# Patient Record
Sex: Male | Born: 1980 | Race: Black or African American | Hispanic: No | Marital: Single | Smoking: Current every day smoker
Health system: Southern US, Community
[De-identification: ages and names within clinical notes are randomized; demographics above are authoritative.]

## PROBLEM LIST (undated history)

## (undated) DIAGNOSIS — Z8619 Personal history of other infectious and parasitic diseases: Secondary | ICD-10-CM

## (undated) HISTORY — DX: Personal history of other infectious and parasitic diseases: Z86.19

## (undated) HISTORY — PX: WISDOM TOOTH EXTRACTION: SHX21

---

## 2018-04-12 ENCOUNTER — Encounter (HOSPITAL_COMMUNITY): Payer: Self-pay | Admitting: Emergency Medicine

## 2018-04-12 ENCOUNTER — Other Ambulatory Visit: Payer: Self-pay

## 2018-04-12 ENCOUNTER — Emergency Department (HOSPITAL_COMMUNITY)
Admission: EM | Admit: 2018-04-12 | Discharge: 2018-04-12 | Disposition: A | Discharge status: Home / Self Care | Payer: Self-pay | Attending: Emergency Medicine | Admitting: Emergency Medicine

## 2018-04-12 DIAGNOSIS — H00014 Hordeolum externum left upper eyelid: Secondary | ICD-10-CM | POA: Insufficient documentation

## 2018-04-12 DIAGNOSIS — H01004 Unspecified blepharitis left upper eyelid: Secondary | ICD-10-CM | POA: Insufficient documentation

## 2018-04-12 DIAGNOSIS — H01001 Unspecified blepharitis right upper eyelid: Secondary | ICD-10-CM | POA: Insufficient documentation

## 2018-04-12 MED ORDER — BACITRACIN-POLYMYXIN B 500-10000 UNIT/GM OP OINT: 1.0000 "application " | TOPICAL_OINTMENT | Freq: Every day | OPHTHALMIC | 0 refills | Status: AC

## 2018-04-12 MED ORDER — CLINDAMYCIN HCL 150 MG PO CAPS: 450.0000 mg | ORAL_CAPSULE | Freq: Three times a day (TID) | ORAL | 0 refills | Status: DC

## 2018-04-12 NOTE — ED Notes (Addendum)
Pt reports that for the last 2 weeks he has had swollen eyes when he wakes up in the morning. Pt reports itching and scratching with same. Pt denies any vision problems or drainage.

## 2018-04-12 NOTE — ED Provider Notes (Signed)
[facility] EMERGENCY DEPARTMENT Provider Note   CSN: [id number] Arrival date & time: 04/12/18  2956     History   Chief Complaint Chief Complaint  Patient presents with  . Facial Swelling    HPI Terry Oneill is a 37 y.o. male is here for evaluation of eye swelling.  Patient reports left upper eyelid puffiness since Wednesday, gradually worsening.  Associated with mild tenderness and redness, itching, eyelash crusting and clear watery discharge.  States he feels a firm nodule to the inner upper eyelid and thinks he has a stye.  Notices that the swelling to the eyelid is worse in the mornings, he attributes this to sleeping on his left side.  He has tried warm compresses and stye relief eyedrops.  He wears glasses and contact lenses for up to a month at a time without removing them.  States he has had crusting and dry skin peeling around the base of his lashes for a long time. He denies any vision changes, eye redness, headache.  States he has had and improving, nontender stye to the right upper eyelid for over 1 week.    HPI  History reviewed. No pertinent past medical history.  There are no active problems to display for this patient.   History reviewed. No pertinent surgical history.      Home Medications    Prior to Admission medications   Medication Sig Start Date End Date Taking? Authorizing Provider  bacitracin-polymyxin b (POLYSPORIN) ophthalmic ointment Place 1 application into the left eye at bedtime for 8 days. Nightly, before bed time 04/12/18 04/20/18  Liberty Handy, PA-C  clindamycin (CLEOCIN) 150 MG capsule Take 3 capsules (450 mg total) by mouth 3 (three) times daily for 7 days. 04/12/18 04/19/18  Liberty Handy, PA-C    Family History No family history on file.  Social History Social History   Tobacco Use  . Smoking status: Not on file  Substance Use Topics  . Alcohol use: Not on file  . Drug use: Not on file      Allergies   Patient has no known allergies.   Review of Systems Review of Systems  Eyes: Positive for discharge and itching.       Eyelid swelling   All other systems reviewed and are negative.    Physical Exam Updated Vital Signs BP (!) 168/118 (BP Location: Right Arm)   Pulse 72   Temp 98 F (36.7 C) (Oral)   Resp 16   Ht 6' (1.829 m)   Wt 127 kg   SpO2 97%   BMI 37.97 kg/m   Physical Exam  Constitutional: He is oriented to person, place, and time. He appears well-developed and well-nourished.  Non-toxic appearance.  HENT:  Head: Normocephalic.  Right Ear: External ear normal.  Left Ear: External ear normal.  Nose: Nose normal.  Left upper eye lid puffiness. No significant surrounding facial edema, erythema, tenderness. No sinus tenderness.   Eyes: Conjunctivae and EOM are normal.  RIGHT EYE: PERRL.  EOMs intact, painless. Small, firm, non tender, not inflamed nodule to upper eyelid consistent with stye. Crust and scales around base of upper eyelashes.  Conjunctiva and sclera white without injection, chemosis, prominent vessels.    LEFT EYE: PERRL.  EOMs intact, painless. Small, firm, mildly tender nodule to medial upper eyelid.  Upper eye lid is puffy mildly erythematous and tender diffusely, upper eye lid droops over eye. Crust and scales around base of upper eyelashes.  Conjunctiva  and sclera white without injection, chemosis, prominent vessels.    Neck: Full passive range of motion without pain.  Cardiovascular: Normal rate.  Pulmonary/Chest: Effort normal. No tachypnea. No respiratory distress.  Musculoskeletal: Normal range of motion.  Neurological: He is alert and oriented to person, place, and time.  Skin: Skin is warm and dry. Capillary refill takes less than 2 seconds.  Psychiatric: His behavior is normal. Thought content normal.     ED Treatments / Results  Labs (all labs ordered are listed, but only abnormal results are displayed) Labs Reviewed  - No data to display  EKG None  Radiology No results found.  Procedures Procedures (including critical care time)  Medications Ordered in ED Medications - No data to display   Initial Impression / Assessment and Plan / ED Course  I have reviewed the triage vital signs and the nursing notes.  Pertinent labs & imaging results that were available during my care of the patient were reviewed by me and considered in my medical decision making (see chart for details).    History is most consistent with chronic blepharitis.  This, along with an appropriate use of contact lenses for several weeks at a time, during sleep, probably predisposes patient to stye and/or infection.  Exam as above consistent with an inflamed right, improving stye and left upper eyelid puffiness with a firm, mildly tender nodule to the upper eyelid most likely a stye as well.  He has crusting and scales around the base of his lashes.  He is only minimal focal tenderness along the upper left eyelid but no expanding signs of cellulitis to the orbits, face.  I think his eyelid swelling is most likely inflammatory and dependent from sleeping on his left side, less likely superimposed infection.  However, given sudden acute swelling, tenderness, risk of preseptal cellulitis and complication I feel like it is reasonable to discharge patient with antibiotics for preseptal cellulitis and topical eye drops.  I have explained importance of warm compresses and massage along the stye, over-the-counter eyedrops and baby shampoo to wash crusting from blepharitis.  Patient has an ophthalmologist.  Discussed specific return precautions.  He is to follow-up with ophthalmology in 1 week if stye is enlarging or not improving.  Patient is in agreement and is comfortable with this plan.  Final Clinical Impressions(s) / ED Diagnoses   Final diagnoses:  Blepharitis of upper eyelids of both eyes, unspecified type  Hordeolum externum of left upper  eyelid    ED Discharge Orders         Ordered    clindamycin (CLEOCIN) 150 MG capsule  3 times daily     04/12/18 0734    bacitracin-polymyxin b (POLYSPORIN) ophthalmic ointment  Daily at bedtime     04/12/18 0734           Liberty Handy, PA-C 04/12/18 1610    Gilda Crease, MD 04/12/18 [phone]

## 2018-04-12 NOTE — ED Triage Notes (Signed)
Pt reports both eyes have been swelling at night that usually decreases during the day.  Today he woke up and his left eye was very swollen.  No change in vision.

## 2018-04-12 NOTE — Discharge Instructions (Signed)
You were seen in the ER for eyelid swelling.  I think this is from clogged glands in your eyelid.  Additionally, you have an inflamed stye.  We discussed the risk of superimposed eyelid or eye infection, given the risk, we will treat you with an oral antibiotic to prevent this infection.  I think your symptoms however are mostly from inflammation and clogged hair follicles.  To treat this, use frequent warm compresses or massage eyelids under warm water to break up with the clogged glands.  Avoid contact lens wear until symptoms have resolved.  Use rewetting eyedrops.  Do not touch her eyes with dirty hands.  Additionally, an antibiotic ointment has been prescribed to you.  Uses nightly right before bedtime.  Return to the ER for worsening eyelid swelling, redness, warmth, pain, pus drainage, headache, fevers.  Follow-up with ophthalmology (eye doctor) in 1 week if the symptoms are not improving.

## 2018-04-14 ENCOUNTER — Telehealth: Payer: Self-pay | Admitting: *Deleted

## 2018-04-14 NOTE — Telephone Encounter (Signed)
Pt called regarding Pharmacy not having Rx.  EDCM reviewed chart to find tht Rx was printed and given to pt.  EDCM asked pt to review papers, pt found Rx and had them filled promptly.  No further EDCM needs identified at this time.

## 2018-04-14 NOTE — ED Provider Notes (Signed)
11:04 AM Patient returned to the front desk stating he lost his printed prescriptions for Polysporin Ophthalmic ointment and clindamycin from his visit on 10/12. I called the patient's pharmacy of choice, [facility], and left a message prescribing these medications to the patient.     Anselm Pancoast, PA-C 04/14/18 1111    Cathren Laine, MD 04/14/18 1200

## 2018-06-14 ENCOUNTER — Encounter (HOSPITAL_COMMUNITY): Payer: Self-pay | Admitting: Emergency Medicine

## 2018-06-14 ENCOUNTER — Inpatient Hospital Stay (HOSPITAL_COMMUNITY)
Admission: EM | Admit: 2018-06-14 | Discharge: 2018-06-16 | DRG: 157 | Disposition: A | Discharge status: Home / Self Care | Payer: Self-pay | Attending: General Surgery | Admitting: General Surgery

## 2018-06-14 ENCOUNTER — Emergency Department (HOSPITAL_COMMUNITY): Payer: Self-pay

## 2018-06-14 ENCOUNTER — Inpatient Hospital Stay (HOSPITAL_COMMUNITY): Payer: Self-pay

## 2018-06-14 DIAGNOSIS — S0990XA Unspecified injury of head, initial encounter: Secondary | ICD-10-CM

## 2018-06-14 DIAGNOSIS — J9601 Acute respiratory failure with hypoxia: Secondary | ICD-10-CM | POA: Diagnosis present

## 2018-06-14 DIAGNOSIS — N182 Chronic kidney disease, stage 2 (mild): Secondary | ICD-10-CM | POA: Diagnosis present

## 2018-06-14 DIAGNOSIS — F10129 Alcohol abuse with intoxication, unspecified: Secondary | ICD-10-CM | POA: Diagnosis present

## 2018-06-14 DIAGNOSIS — S0240CA Maxillary fracture, right side, initial encounter for closed fracture: Secondary | ICD-10-CM

## 2018-06-14 DIAGNOSIS — Z978 Presence of other specified devices: Secondary | ICD-10-CM

## 2018-06-14 DIAGNOSIS — S022XXA Fracture of nasal bones, initial encounter for closed fracture: Secondary | ICD-10-CM | POA: Diagnosis present

## 2018-06-14 DIAGNOSIS — T1490XA Injury, unspecified, initial encounter: Secondary | ICD-10-CM

## 2018-06-14 DIAGNOSIS — G9340 Encephalopathy, unspecified: Secondary | ICD-10-CM | POA: Diagnosis present

## 2018-06-14 DIAGNOSIS — S0083XA Contusion of other part of head, initial encounter: Secondary | ICD-10-CM

## 2018-06-14 DIAGNOSIS — S0993XA Unspecified injury of face, initial encounter: Secondary | ICD-10-CM | POA: Diagnosis present

## 2018-06-14 DIAGNOSIS — R739 Hyperglycemia, unspecified: Secondary | ICD-10-CM | POA: Diagnosis present

## 2018-06-14 LAB — TYPE AND SCREEN
ABO/RH(D): O POS
Antibody Screen: NEGATIVE
UNIT DIVISION: 0
Unit division: 0

## 2018-06-14 LAB — CBC
HCT: 48.5 % (ref 39.0–52.0)
HEMATOCRIT: 49.4 % (ref 39.0–52.0)
Hemoglobin: 15 g/dL (ref 13.0–17.0)
Hemoglobin: 14.3 g/dL (ref 13.0–17.0)
MCH: 28.8 pg (ref 26.0–34.0)
MCH: 28.3 pg (ref 26.0–34.0)
MCHC: 30.4 g/dL (ref 30.0–36.0)
MCHC: 29.5 g/dL — ABNORMAL LOW (ref 30.0–36.0)
MCV: 94.8 fL (ref 80.0–100.0)
MCV: 95.8 fL (ref 80.0–100.0)
Platelets: 209 10*3/uL (ref 150–400)
Platelets: 217 10*3/uL (ref 150–400)
RBC: 5.21 MIL/uL (ref 4.22–5.81)
RBC: 5.06 MIL/uL (ref 4.22–5.81)
RDW: 14 % (ref 11.5–15.5)
RDW: 14.2 % (ref 11.5–15.5)
WBC: 10 10*3/uL (ref 4.0–10.5)
WBC: 9.6 10*3/uL (ref 4.0–10.5)
nRBC: 0 % (ref 0.0–0.2)
nRBC: 0 % (ref 0.0–0.2)

## 2018-06-14 LAB — RAPID URINE DRUG SCREEN, HOSP PERFORMED
Amphetamines: NOT DETECTED
BENZODIAZEPINES: NOT DETECTED
Barbiturates: NOT DETECTED
Cocaine: NOT DETECTED
Opiates: NOT DETECTED
Tetrahydrocannabinol: POSITIVE — AB

## 2018-06-14 LAB — COMPREHENSIVE METABOLIC PANEL
ALT: 48 U/L — AB (ref 0–44)
AST: 55 U/L — ABNORMAL HIGH (ref 15–41)
Albumin: 4.3 g/dL (ref 3.5–5.0)
Alkaline Phosphatase: 65 U/L (ref 38–126)
Anion gap: 13 (ref 5–15)
BUN: 13 mg/dL (ref 6–20)
CO2: 21 mmol/L — ABNORMAL LOW (ref 22–32)
CREATININE: 1.55 mg/dL — AB (ref 0.61–1.24)
Calcium: 9 mg/dL (ref 8.9–10.3)
Chloride: 109 mmol/L (ref 98–111)
GFR calc Af Amer: >60 mL/min (ref >60)
GFR calc non Af Amer: 57 mL/min — ABNORMAL LOW (ref >60)
Glucose, Bld: 135 mg/dL — ABNORMAL HIGH (ref 70–99)
Potassium: 3.7 mmol/L (ref 3.5–5.1)
Sodium: 143 mmol/L (ref 135–145)
Total Bilirubin: 0.7 mg/dL (ref 0.3–1.2)
Total Protein: 7 g/dL (ref 6.5–8.1)

## 2018-06-14 LAB — BASIC METABOLIC PANEL
Anion gap: 13 (ref 5–15)
Anion gap: 10 (ref 5–15)
BUN: 12 mg/dL (ref 6–20)
BUN: 9 mg/dL (ref 6–20)
CO2: 20 mmol/L — ABNORMAL LOW (ref 22–32)
CO2: 20 mmol/L — ABNORMAL LOW (ref 22–32)
Calcium: 8.8 mg/dL — ABNORMAL LOW (ref 8.9–10.3)
Calcium: 8.7 mg/dL — ABNORMAL LOW (ref 8.9–10.3)
Chloride: 110 mmol/L (ref 98–111)
Chloride: 113 mmol/L — ABNORMAL HIGH (ref 98–111)
Creatinine, Ser: 1.42 mg/dL — ABNORMAL HIGH (ref 0.61–1.24)
Creatinine, Ser: 1.26 mg/dL — ABNORMAL HIGH (ref 0.61–1.24)
GFR calc Af Amer: >60 mL/min (ref >60)
GFR calc Af Amer: >60 mL/min (ref >60)
GFR calc non Af Amer: >60 mL/min (ref >60)
GLUCOSE: 93 mg/dL (ref 70–99)
Glucose, Bld: 100 mg/dL — ABNORMAL HIGH (ref 70–99)
Potassium: 4.3 mmol/L (ref 3.5–5.1)
Potassium: 3.9 mmol/L (ref 3.5–5.1)
SODIUM: 143 mmol/L (ref 135–145)
Sodium: 143 mmol/L (ref 135–145)

## 2018-06-14 LAB — I-STAT CHEM 8, ED
BUN: 15 mg/dL (ref 6–20)
CREATININE: 2 mg/dL — AB (ref 0.61–1.24)
Calcium, Ion: 1.11 mmol/L — ABNORMAL LOW (ref 1.15–1.40)
Chloride: 110 mmol/L (ref 98–111)
Glucose, Bld: 130 mg/dL — ABNORMAL HIGH (ref 70–99)
HCT: 49 % (ref 39.0–52.0)
HEMOGLOBIN: 16.7 g/dL (ref 13.0–17.0)
Potassium: 3.8 mmol/L (ref 3.5–5.1)
Sodium: 143 mmol/L (ref 135–145)
TCO2: 22 mmol/L (ref 22–32)

## 2018-06-14 LAB — PREPARE FRESH FROZEN PLASMA
UNIT DIVISION: 0
Unit division: 0

## 2018-06-14 LAB — BPAM FFP
Blood Product Expiration Date: 201912252359
Blood Product Expiration Date: 201912262359
ISSUE DATE / TIME: 201912140125
ISSUE DATE / TIME: 201912140125
Unit Type and Rh: 6200
Unit Type and Rh: 6200

## 2018-06-14 LAB — I-STAT CG4 LACTIC ACID, ED: Lactic Acid, Venous: 4.1 mmol/L (ref 0.5–1.9)

## 2018-06-14 LAB — URINALYSIS, ROUTINE W REFLEX MICROSCOPIC
Bilirubin Urine: NEGATIVE
Glucose, UA: NEGATIVE mg/dL
Ketones, ur: NEGATIVE mg/dL
Leukocytes, UA: NEGATIVE
Nitrite: NEGATIVE
Protein, ur: 30 mg/dL — AB
Specific Gravity, Urine: 1.021 (ref 1.005–1.030)
pH: 6 (ref 5.0–8.0)

## 2018-06-14 LAB — ETHANOL: Alcohol, Ethyl (B): 262 mg/dL — ABNORMAL HIGH (ref <10)

## 2018-06-14 LAB — BPAM RBC
Blood Product Expiration Date: 202001112359
Blood Product Expiration Date: 202001112359
ISSUE DATE / TIME: 201912140126
ISSUE DATE / TIME: 201912140126
Unit Type and Rh: 5100
Unit Type and Rh: 5100

## 2018-06-14 LAB — I-STAT ARTERIAL BLOOD GAS, ED
ACID-BASE DEFICIT: 8 mmol/L — AB (ref 0.0–2.0)
Bicarbonate: 19.7 mmol/L — ABNORMAL LOW (ref 20.0–28.0)
O2 Saturation: 100 %
TCO2: 21 mmol/L — ABNORMAL LOW (ref 22–32)
pCO2 arterial: 45.5 mmHg (ref 32.0–48.0)
pH, Arterial: 7.244 — ABNORMAL LOW (ref 7.350–7.450)
pO2, Arterial: 315 mmHg — ABNORMAL HIGH (ref 83.0–108.0)

## 2018-06-14 LAB — CDS SEROLOGY

## 2018-06-14 LAB — PROTIME-INR
INR: 0.98
Prothrombin Time: 12.9 seconds (ref 11.4–15.2)

## 2018-06-14 LAB — HIV ANTIBODY (ROUTINE TESTING W REFLEX): HIV Screen 4th Generation wRfx: NONREACTIVE

## 2018-06-14 LAB — TRIGLYCERIDES: Triglycerides: 215 mg/dL — ABNORMAL HIGH (ref <150)

## 2018-06-14 LAB — ABO/RH: ABO/RH(D): O POS

## 2018-06-14 MED ORDER — CHLORHEXIDINE GLUCONATE 0.12% ORAL RINSE (MEDLINE KIT)
15.0000 mL | Freq: Two times a day (BID) | OROMUCOSAL | Status: DC
  Administered 2018-06-14 – 2018-06-15 (×3): 15 mL via OROMUCOSAL

## 2018-06-14 MED ORDER — ORAL CARE MOUTH RINSE
15.0000 mL | OROMUCOSAL | Status: DC
  Administered 2018-06-14 – 2018-06-15 (×8): 15 mL via OROMUCOSAL

## 2018-06-14 MED ORDER — ROCURONIUM BROMIDE 50 MG/5ML IV SOLN
INTRAVENOUS | Status: AC | PRN
  Administered 2018-06-14: 100 mg via INTRAVENOUS

## 2018-06-14 MED ORDER — PANTOPRAZOLE SODIUM 40 MG IV SOLR
40.0000 mg | Freq: Every day | INTRAVENOUS | Status: DC
  Administered 2018-06-14: 40 mg via INTRAVENOUS
  Filled 2018-06-14: qty 40

## 2018-06-14 MED ORDER — DEXTROSE-NACL 5-0.9 % IV SOLN
INTRAVENOUS | Status: AC
  Administered 2018-06-14: 06:00:00 via INTRAVENOUS

## 2018-06-14 MED ORDER — BISACODYL 10 MG RE SUPP: 10.0000 mg | Freq: Every day | RECTAL | Status: DC | PRN

## 2018-06-14 MED ORDER — HYDRALAZINE HCL 20 MG/ML IJ SOLN
10.0000 mg | INTRAMUSCULAR | Status: DC | PRN
  Filled 2018-06-14: qty 1

## 2018-06-14 MED ORDER — ONDANSETRON 4 MG PO TBDP
4.0000 mg | ORAL_TABLET | Freq: Four times a day (QID) | ORAL | Status: DC | PRN
  Administered 2018-06-15: 4 mg via ORAL
  Filled 2018-06-14: qty 1

## 2018-06-14 MED ORDER — FENTANYL CITRATE (PF) 100 MCG/2ML IJ SOLN
50.0000 ug | Freq: Once | INTRAMUSCULAR | Status: AC
  Administered 2018-06-14: 50 ug via INTRAVENOUS

## 2018-06-14 MED ORDER — SENNOSIDES 8.8 MG/5ML PO SYRP
5.0000 mL | ORAL_SOLUTION | Freq: Two times a day (BID) | ORAL | Status: DC | PRN
  Filled 2018-06-14: qty 5

## 2018-06-14 MED ORDER — PANTOPRAZOLE SODIUM 40 MG PO TBEC
40.0000 mg | DELAYED_RELEASE_TABLET | Freq: Every day | ORAL | Status: DC
  Administered 2018-06-15 – 2018-06-16 (×2): 40 mg via ORAL
  Filled 2018-06-14 (×2): qty 1

## 2018-06-14 MED ORDER — SUCCINYLCHOLINE CHLORIDE 20 MG/ML IJ SOLN
INTRAMUSCULAR | Status: AC | PRN
  Administered 2018-06-14: 150 mg via INTRAVENOUS

## 2018-06-14 MED ORDER — DEXTROSE-NACL 5-0.45 % IV SOLN
INTRAVENOUS | Status: DC
  Administered 2018-06-14 – 2018-06-15 (×2): via INTRAVENOUS

## 2018-06-14 MED ORDER — ETOMIDATE 2 MG/ML IV SOLN
INTRAVENOUS | Status: AC | PRN
  Administered 2018-06-14: 40 mg via INTRAVENOUS

## 2018-06-14 MED ORDER — ENOXAPARIN SODIUM 40 MG/0.4ML ~~LOC~~ SOLN
40.0000 mg | SUBCUTANEOUS | Status: DC
  Administered 2018-06-14 – 2018-06-15 (×2): 40 mg via SUBCUTANEOUS
  Filled 2018-06-14 (×3): qty 0.4

## 2018-06-14 MED ORDER — ACETAMINOPHEN 325 MG PO TABS
650.0000 mg | ORAL_TABLET | ORAL | Status: DC | PRN
  Administered 2018-06-16: 650 mg via ORAL
  Filled 2018-06-14: qty 2

## 2018-06-14 MED ORDER — PROPOFOL 1000 MG/100ML IV EMUL
0.0000 ug/kg/min | INTRAVENOUS | Status: DC
  Administered 2018-06-14: 30 ug/kg/min via INTRAVENOUS
  Administered 2018-06-14: 40 ug/kg/min via INTRAVENOUS
  Administered 2018-06-14: 10 ug/kg/min via INTRAVENOUS
  Administered 2018-06-14 – 2018-06-15 (×2): 30 ug/kg/min via INTRAVENOUS
  Administered 2018-06-15: 20 ug/kg/min via INTRAVENOUS
  Filled 2018-06-14 (×10): qty 100

## 2018-06-14 MED ORDER — FENTANYL BOLUS VIA INFUSION
50.0000 ug | INTRAVENOUS | Status: DC | PRN
  Filled 2018-06-14: qty 50

## 2018-06-14 MED ORDER — PROPOFOL 1000 MG/100ML IV EMUL
INTRAVENOUS | Status: AC
  Filled 2018-06-14: qty 100

## 2018-06-14 MED ORDER — IOHEXOL 300 MG/ML  SOLN
80.0000 mL | Freq: Once | INTRAMUSCULAR | Status: AC | PRN
  Administered 2018-06-14: 80 mL via INTRAVENOUS

## 2018-06-14 MED ORDER — ONDANSETRON HCL 4 MG/2ML IJ SOLN
4.0000 mg | Freq: Four times a day (QID) | INTRAMUSCULAR | Status: DC | PRN
  Administered 2018-06-15: 4 mg via INTRAVENOUS
  Filled 2018-06-14: qty 2

## 2018-06-14 MED ORDER — FENTANYL CITRATE (PF) 100 MCG/2ML IJ SOLN
INTRAMUSCULAR | Status: AC | PRN
  Administered 2018-06-14: 100 ug via INTRAVENOUS

## 2018-06-14 MED ORDER — FENTANYL 2500MCG IN NS 250ML (10MCG/ML) PREMIX INFUSION
25.0000 ug/h | INTRAVENOUS | Status: DC
  Administered 2018-06-14: 300 ug/h via INTRAVENOUS
  Administered 2018-06-14: 50 ug/h via INTRAVENOUS
  Administered 2018-06-15: 250 ug/h via INTRAVENOUS
  Filled 2018-06-14 (×3): qty 250

## 2018-06-14 MED ORDER — FENTANYL CITRATE (PF) 100 MCG/2ML IJ SOLN
INTRAMUSCULAR | Status: AC
  Filled 2018-06-14: qty 2
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
## 2018-06-14 NOTE — ED Notes (Signed)
Per family member, pts mother and father are trying to be contacted. Mother's name is Parks NeptuneBrenda Stone and Father's name is [employer] Metals Companyosevelt Stone.
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
## 2018-06-15 LAB — CBC
HCT: 42.9 % (ref 39.0–52.0)
HEMOGLOBIN: 13.1 g/dL (ref 13.0–17.0)
MCH: 28.6 pg (ref 26.0–34.0)
MCHC: 30.5 g/dL (ref 30.0–36.0)
MCV: 93.7 fL (ref 80.0–100.0)
Platelets: 192 10*3/uL (ref 150–400)
RBC: 4.58 MIL/uL (ref 4.22–5.81)
RDW: 14.4 % (ref 11.5–15.5)
WBC: 12 10*3/uL — ABNORMAL HIGH (ref 4.0–10.5)
nRBC: 0 % (ref 0.0–0.2)

## 2018-06-15 LAB — BASIC METABOLIC PANEL
Anion gap: 9 (ref 5–15)
BUN: 13 mg/dL (ref 6–20)
CHLORIDE: 110 mmol/L (ref 98–111)
CO2: 22 mmol/L (ref 22–32)
Calcium: 8.8 mg/dL — ABNORMAL LOW (ref 8.9–10.3)
Creatinine, Ser: 1.55 mg/dL — ABNORMAL HIGH (ref 0.61–1.24)
GFR calc Af Amer: >60 mL/min (ref >60)
GFR calc non Af Amer: 57 mL/min — ABNORMAL LOW (ref >60)
Glucose, Bld: 166 mg/dL — ABNORMAL HIGH (ref 70–99)
Potassium: 3.7 mmol/L (ref 3.5–5.1)
SODIUM: 141 mmol/L (ref 135–145)

## 2018-06-15 MED ORDER — TRAMADOL HCL 50 MG PO TABS: 50.0000 mg | ORAL_TABLET | Freq: Four times a day (QID) | ORAL | Status: DC | PRN

## 2018-06-15 MED ORDER — OXYCODONE HCL 5 MG PO TABS: 5.0000 mg | ORAL_TABLET | ORAL | Status: DC | PRN
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
## 2018-06-15 NOTE — Discharge Instructions (Addendum)
Nasal Fracture A nasal fracture is a break or crack in the bones or cartilage of the nose. Minor breaks do not require treatment. These breaks usually heal on their own after about one month. Serious breaks may require surgery. What are the causes? This injury is usually caused by a blunt injury to the nose. This type of injury often occurs from:  Contact sports.  Car accidents.  Falls.  Getting punched.  What are the signs or symptoms? Symptoms of this injury include:  Pain.  Swelling of the nose.  Bleeding from the nose.  Bruising around the nose or eyes. This may include having black eyes.  Crooked appearance of the nose.  How is this diagnosed? This injury may be diagnosed with a physical exam. The health care provider will gently feel the nose for signs of broken bones. He or she will look inside the nostrils to make sure that there is not a blood-filled swelling on the dividing wall between the nostrils (septal hematoma). X-rays of the nose may not show a nasal fracture even when one is present. In some cases, X-rays or a CT scan may be done 1-5 days after the injury. Sometimes, the health care provider will want to wait until the swelling has gone down. How is this treated? Often, minor fractures that have caused no deformity do not require treatment. More serious fractures in which bones have moved out of position may require surgery, which will take place after the swelling is gone. Surgery will stabilize and align the fracture. In some cases, a health care provider may be able to reposition the bones without surgery. This may be done in the health care provider's office after medicine is given to numb the area (local anesthetic). Follow these instructions at home:  If directed, apply ice to the injured area: ? Put ice in a plastic bag. ? Place a towel between your skin and the bag. ? Leave the ice on for 20 minutes, 2-3 times per day.  Take over-the-counter and  prescription medicines only as told by your health care provider.  If your nose starts to bleed, sit in an upright position while you squeeze the soft parts of your nose against the dividing wall between your nostrils (septum) for 10 minutes.  Try to avoid blowing your nose.  Return to your normal activities as told by your health care provider. Ask your health care provider what activities are safe for you.  Avoid contact sports for 3-4 weeks or as told by your health care provider.  Keep all follow-up visits as told by your health care provider. This is important. Contact a health care provider if:  Your pain increases or becomes severe.  You continue to have nosebleeds.  The shape of your nose does not return to normal within 5 days.  You have pus draining out of your nose. Get help right away if:  You have bleeding from your nose that does not stop after you pinch your nostrils closed for 20 minutes and keep ice on your nose.  You have clear fluid draining out of your nose.  You notice a grape-like swelling on the septum. This swelling is a collection of blood (hematoma) that must be drained to help prevent infection.  You have difficulty moving your eyes.  You have repeated vomiting. This information is not intended to replace advice given to you by your health care provider. Make sure you discuss any questions you have with your health care provider.  Document Released: 06/15/2000 Document Revised: 11/24/2015 Document Reviewed: 07/26/2014 Elsevier Interactive Patient Education  2018 [employer] Inc[employer]     YOUR KIDNEY LABS WERE ELEVATED.  THIS MAY BE JUST RELATED TO DEHYDRATION FROM TRAUMA, BUT YOU SHOULD SEE PRIMARY CARE TO DISCUSS.  IF THIS IS NOT MANAGED, IT CAN BECOME WORSE AND LEAD TO KIDNEY FAILURE.       Chronic Kidney Disease, Adult Chronic kidney disease (CKD) happens when the kidneys are damaged during a time of 3 or more months. The kidneys are two organs that  do many important jobs in the body. These jobs include:  Removing wastes and extra fluids from the blood.  Making hormones that maintain the amount of fluid in your tissues and blood vessels.  Making sure that the body has the right amount of fluids and chemicals.  Most of the time, this condition does not go away, but it can usually be controlled. Steps must be taken to slow down the kidney damage or stop it from getting worse. Otherwise, the kidneys may stop working. Follow these instructions at home:  Follow your diet as told by your doctor. You may need to avoid alcohol, salty foods (sodium), and foods that are high in potassium, calcium, and protein.  Take over-the-counter and prescription medicines only as told by your doctor. Do not take any new medicines unless your doctor says you can do that. These include vitamins and minerals. ? Medicines and nutritional supplements can make kidney damage worse. ? Your doctor may need to change how much medicine you take.  Do not use any tobacco products. These include cigarettes, chewing tobacco, and e-cigarettes. If you need help quitting, ask your doctor.  Keep all follow-up visits as told by your doctor. This is important.  Check your blood pressure. Tell your doctor if there are changes to your blood pressure.  Get to a healthy weight. Stay at that weight. If you need help with this, ask your doctor.  Start or continue an exercise plan. Try to exercise at least 30 minutes a day, 5 days a week.  Stay up-to-date with your shots (immunizations) as told by your doctor. Contact a doctor if:  Your symptoms get worse.  You have new symptoms. Get help right away if:  You have symptoms of end-stage kidney disease. These include: ? Headaches. ? Skin that is darker or lighter than normal. ? Numbness in your hands or feet. ? Easy bruising. ? Having hiccups often. ? Chest pain. ? Shortness of breath. ? Stopping of menstrual periods in  women.  You have a fever.  You are making very little pee (urine).  You have pain or bleeding when you pee (urinate). This information is not intended to replace advice given to you by your health care provider. Make sure you discuss any questions you have with your health care provider. Document Released: 09/12/2009 Document Revised: 11/24/2015 Document Reviewed: 02/15/2012 Elsevier Interactive Patient Education  2017 [employer].

## 2018-06-15 NOTE — Procedures (Signed)
Extubation Procedure Note  Patient Details:   Name: Terry Oneill DOB: 10/30/1980 MRN: [id number]   Airway Documentation:    Vent end date: 06/15/18 Vent end time: 0916   Evaluation  O2 sats: stable throughout Complications: No apparent complications Patient did tolerate procedure well. Bilateral Breath Sounds: Clear, Diminished   Yes - whisper only  Positive cuff leak noted.  Pt placed on [place] 3 L, no stridor noted.  RN working with pt on incentive spirometer.  Forest BeckerJean S Ahmaya Ostermiller 06/15/2018, 9:22 AM

## 2018-06-16 ENCOUNTER — Encounter (HOSPITAL_COMMUNITY): Payer: Self-pay | Admitting: Emergency Medicine

## 2018-06-16 MED ORDER — ACETAMINOPHEN 325 MG PO TABS: 650.0000 mg | ORAL_TABLET | ORAL | Status: AC | PRN
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
## 2018-06-27 ENCOUNTER — Ambulatory Visit: Payer: Self-pay | Admitting: Plastic Surgery

## 2018-06-27 ENCOUNTER — Ambulatory Visit (INDEPENDENT_AMBULATORY_CARE_PROVIDER_SITE_OTHER): Payer: Self-pay | Admitting: Plastic Surgery

## 2018-06-27 ENCOUNTER — Encounter: Payer: Self-pay | Admitting: Plastic Surgery

## 2018-06-27 VITALS — BP 164/113 | HR 98 | Temp 98.1°F | Ht 72.0 in | Wt 267.5 lb

## 2018-06-27 DIAGNOSIS — S022XXA Fracture of nasal bones, initial encounter for closed fracture: Secondary | ICD-10-CM

## 2018-06-27 DIAGNOSIS — S0993XA Unspecified injury of face, initial encounter: Secondary | ICD-10-CM
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
## 2018-06-30 ENCOUNTER — Encounter: Payer: Self-pay | Admitting: Plastic Surgery

## 2018-07-09 ENCOUNTER — Encounter: Payer: Self-pay | Admitting: Family Medicine

## 2018-07-09 ENCOUNTER — Ambulatory Visit (INDEPENDENT_AMBULATORY_CARE_PROVIDER_SITE_OTHER): Payer: Self-pay | Admitting: Family Medicine

## 2018-07-09 VITALS — BP 140/106 | HR 70 | Temp 98.3°F | Ht 72.0 in | Wt 273.6 lb

## 2018-07-09 DIAGNOSIS — I10 Essential (primary) hypertension: Secondary | ICD-10-CM

## 2018-07-09 DIAGNOSIS — R42 Dizziness and giddiness: Secondary | ICD-10-CM

## 2018-07-09 DIAGNOSIS — Z114 Encounter for screening for human immunodeficiency virus [HIV]: Secondary | ICD-10-CM

## 2018-07-09 DIAGNOSIS — Z0000 Encounter for general adult medical examination without abnormal findings: Secondary | ICD-10-CM

## 2018-07-09 LAB — COMPREHENSIVE METABOLIC PANEL
ALK PHOS: 72 U/L (ref 39–117)
ALT: 26 U/L (ref 0–53)
AST: 17 U/L (ref 0–37)
Albumin: 4.3 g/dL (ref 3.5–5.2)
BILIRUBIN TOTAL: 0.3 mg/dL (ref 0.2–1.2)
BUN: 19 mg/dL (ref 6–23)
CALCIUM: 9.7 mg/dL (ref 8.4–10.5)
CO2: 27 meq/L (ref 19–32)
Chloride: 105 mEq/L (ref 96–112)
Creatinine, Ser: 1.33 mg/dL (ref 0.40–1.50)
GFR: 78.16 mL/min (ref >60.00)
GLUCOSE: 98 mg/dL (ref 70–99)
POTASSIUM: 4.3 meq/L (ref 3.5–5.1)
Sodium: 140 mEq/L (ref 135–145)
Total Protein: 6.9 g/dL (ref 6.0–8.3)

## 2018-07-09 LAB — CBC WITH DIFFERENTIAL/PLATELET
Basophils Absolute: 0 10*3/uL (ref 0.0–0.1)
Basophils Relative: 0.7 % (ref 0.0–3.0)
Eosinophils Absolute: 0.3 10*3/uL (ref 0.0–0.7)
Eosinophils Relative: 5 % (ref 0.0–5.0)
HCT: 44.5 % (ref 39.0–52.0)
Hemoglobin: 14.5 g/dL (ref 13.0–17.0)
LYMPHS ABS: 1.9 10*3/uL (ref 0.7–4.0)
Lymphocytes Relative: 36.9 % (ref 12.0–46.0)
MCHC: 32.5 g/dL (ref 30.0–36.0)
MCV: 91.1 fl (ref 78.0–100.0)
MONO ABS: 0.5 10*3/uL (ref 0.1–1.0)
MONOS PCT: 10.6 % (ref 3.0–12.0)
NEUTROS ABS: 2.4 10*3/uL (ref 1.4–7.7)
NEUTROS PCT: 46.8 % (ref 43.0–77.0)
PLATELETS: 237 10*3/uL (ref 150.0–400.0)
RBC: 4.89 Mil/uL (ref 4.22–5.81)
RDW: 14.4 % (ref 11.5–15.5)
WBC: 5.1 10*3/uL (ref 4.0–10.5)

## 2018-07-09 LAB — MICROALBUMIN / CREATININE URINE RATIO
CREATININE, U: 181.6 mg/dL
MICROALB/CREAT RATIO: 0.5 mg/g (ref 0.0–30.0)
Microalb, Ur: 1 mg/dL (ref 0.0–1.9)

## 2018-07-09 LAB — TSH: TSH: 4.86 u[IU]/mL — ABNORMAL HIGH (ref 0.35–4.50)

## 2018-07-09 LAB — LIPID PANEL
CHOL/HDL RATIO: 5
Cholesterol: 249 mg/dL — ABNORMAL HIGH (ref 0–200)
HDL: 51 mg/dL (ref >39.00)
LDL Cholesterol: 171 mg/dL — ABNORMAL HIGH (ref 0–99)
NonHDL: 198.1
TRIGLYCERIDES: 134 mg/dL (ref 0.0–149.0)
VLDL: 26.8 mg/dL (ref 0.0–40.0)

## 2018-07-09 MED ORDER — AMLODIPINE BESYLATE 5 MG PO TABS: 5.0000 mg | ORAL_TABLET | Freq: Every day | ORAL | 1 refills | Status: DC

## 2018-07-09 MED ORDER — MECLIZINE HCL 25 MG PO TABS: 25.0000 mg | ORAL_TABLET | Freq: Three times a day (TID) | ORAL | 0 refills | Status: AC | PRN

## 2018-07-09 NOTE — Patient Instructions (Signed)
For blood pressure. We are going to start norvasc (amolodipine) you will take this once/day. Will see you back in one month.   Vertigo  Vertigo means that you feel like you are moving when you are not. Vertigo can also make you feel like things around you are moving when they are not. This feeling can come and go at any time. Vertigo often goes away on its own. Follow these instructions at home:  Avoid making fast movements.  Avoid driving.  Avoid using heavy machinery.  Avoid doing any task or activity that might cause danger to you or other people if you would have a vertigo attack while you are doing it.  Sit down right away if you feel dizzy or have trouble with your balance.  Take over-the-counter and prescription medicines only as told by your doctor.  Follow instructions from your doctor about which positions or movements you should avoid.  Drink enough fluid to keep your pee (urine) clear or pale yellow.  Keep all follow-up visits as told by your doctor. This is important. Contact a doctor if:  Medicine does not help your vertigo.  You have a fever.  Your problems get worse or you have new symptoms.  Your family or friends see changes in your behavior.  You feel sick to your stomach (nauseous) or you throw up (vomit).  You have a "pins and needles" feeling or you are numb in part of your body. Get help right away if:  You have trouble moving or talking.  You are always dizzy.  You pass out (faint).  You get very bad headaches.  You feel weak or have trouble using your hands, arms, or legs.  You have changes in your hearing.  You have changes in your seeing (vision).  You get a stiff neck.  Bright light starts to bother you. This information is not intended to replace advice given to you by your health care provider. Make sure you discuss any questions you have with your health care provider. Document Released: 03/27/2008 Document Revised: 11/24/2015  Document Reviewed: 10/11/2014 Elsevier Interactive Patient Education  [employer].
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
## 2018-07-10 LAB — HIV ANTIBODY (ROUTINE TESTING W REFLEX): HIV: NONREACTIVE

## 2018-07-18 ENCOUNTER — Ambulatory Visit: Payer: Self-pay | Admitting: Plastic Surgery

## 2018-07-18 ENCOUNTER — Ambulatory Visit (INDEPENDENT_AMBULATORY_CARE_PROVIDER_SITE_OTHER): Payer: Self-pay | Admitting: Plastic Surgery

## 2018-07-18 ENCOUNTER — Encounter: Payer: Self-pay | Admitting: Plastic Surgery

## 2018-07-18 VITALS — BP 143/94 | HR 86 | Temp 98.9°F | Ht 72.0 in | Wt 270.0 lb

## 2018-07-18 DIAGNOSIS — S0993XA Unspecified injury of face, initial encounter: Secondary | ICD-10-CM

## 2018-07-18 DIAGNOSIS — S022XXA Fracture of nasal bones, initial encounter for closed fracture: Secondary | ICD-10-CM

## 2018-07-19 ENCOUNTER — Encounter: Payer: Self-pay | Admitting: Plastic Surgery
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
## 2018-08-07 NOTE — Progress Notes (Signed)
Patient: Terry Oneill MRN: [id number] DOB: [birth year] PCP: Orland Mustard, MD     Subjective:  Chief Complaint  Patient presents with  . Hypertension    HPI: The patient is a 38 y.o. male who presents today for blood pressure follow up.   Hypertension: Here for follow up of hypertension.  Currently on amlodipine 5mg  .  He doesn't take his medication everyday. He states he misses a few days a week, but denies any side effects. Exercise includes none. Weight has been stable. Denies any chest pain, headaches, shortness of breath, vision changes, swelling in lower extremities. Still smoking.   Dizziness is better. Pretty much resolved. He will occasionally have when he bends over and stands back up.   He also has a "stye" on his left upper eyelid. He states he went to ER for this and was told it was infected and put on clindamycin. He has no vision changes or visual deficits. It is not painful or hot to touch.   Review of Systems  Constitutional: Negative for fatigue.  Eyes: Negative for visual disturbance.  Respiratory: Negative for shortness of breath.   Cardiovascular: Negative for chest pain.  Gastrointestinal: Negative for abdominal pain, nausea and vomiting.  Neurological: Negative for dizziness and headaches.    Allergies Patient has No Known Allergies.  Past Medical History Patient  has a past medical history of History of chicken pox.  Surgical History Patient  has a past surgical history that includes Wisdom tooth extraction.  Family History Pateint's Family history is unknown by patient.  Social History Patient  reports that he has been smoking. He has never used smokeless tobacco. He reports current alcohol use.    Objective: Vitals:   08/08/18 1309 08/08/18 1321  BP: (!) 164/100 (!) 160/110  Pulse: 89   Temp: 98.4 F (36.9 C)   TempSrc: Oral   SpO2: 98%   Weight: 269 lb 9.6 oz (122.3 kg)   Height: 6' (1.829 m)     Body mass index is 36.56  kg/m.  Physical Exam Vitals signs reviewed.  Constitutional:      Appearance: He is obese.     Comments: Tobacco odor.   Eyes:     Comments: Lesion in left upper eyelid at base of eyelid. Erythematous and firm.   Neck:     Musculoskeletal: Normal range of motion and neck supple.  Cardiovascular:     Rate and Rhythm: Normal rate and regular rhythm.     Heart sounds: Normal heart sounds.  Pulmonary:     Effort: Pulmonary effort is normal.     Breath sounds: Normal breath sounds.  Abdominal:     General: Abdomen is flat. Bowel sounds are normal.     Palpations: Abdomen is soft.  Neurological:     General: No focal deficit present.     Mental Status: He is alert and oriented to person, place, and time.        Assessment/plan: 1. Benign essential HTN Way above goal and actually worse today than when I saw him a month ago. He has been poor at taking medication daily, but did take everyday this week. WE are going to increase his norvasc to 10mg  and add on lisinopril 20mg . Discussed he is going to need more than one agent to get his blood pressure under control. Side effects of ACE-I discussed including dry cough and angioedema. He is to call me if feels like they have a dry cough and they are to call  911 or go to ER if any signs/symptoms of angioedema.  F/u in one month.   2. Abnormal thyroid blood test  - TSH  3. Chalazion of left upper eyelid Chalazion vs. Hordeloum. 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 He is declining this at this time. Discussed if still present at f/u he will need to have removed. He states he has no time off from work right now. Will see him back in one month.    Return in about 1 month (around 09/06/2018) for blood pressure .   Orland MustardAllison Eliska Hamil, MD [facility]   08/08/2018

## 2018-08-08 ENCOUNTER — Encounter: Payer: Self-pay | Admitting: Family Medicine

## 2018-08-08 ENCOUNTER — Ambulatory Visit (INDEPENDENT_AMBULATORY_CARE_PROVIDER_SITE_OTHER): Payer: Self-pay | Admitting: Family Medicine

## 2018-08-08 VITALS — BP 160/110 | HR 89 | Temp 98.4°F | Ht 72.0 in | Wt 269.6 lb

## 2018-08-08 DIAGNOSIS — I10 Essential (primary) hypertension: Secondary | ICD-10-CM

## 2018-08-08 DIAGNOSIS — H0014 Chalazion left upper eyelid: Secondary | ICD-10-CM

## 2018-08-08 DIAGNOSIS — R7989 Other specified abnormal findings of blood chemistry: Secondary | ICD-10-CM

## 2018-08-08 LAB — TSH: TSH: 2.43 u[IU]/mL (ref 0.35–4.50)

## 2018-08-08 MED ORDER — LISINOPRIL 20 MG PO TABS: 20.0000 mg | ORAL_TABLET | Freq: Every day | ORAL | 0 refills | Status: AC

## 2018-08-08 MED ORDER — AMLODIPINE BESYLATE 10 MG PO TABS: 10.0000 mg | ORAL_TABLET | Freq: Every day | ORAL | 1 refills | Status: AC

## 2018-08-08 NOTE — Patient Instructions (Signed)
Continue amlodipine at 10mg  daily.  Sending in lisinopril at 20mg /day.   Will be on both of these pills to control your blood pressure.   i'll see you back in 1 month.

## 2018-09-12 ENCOUNTER — Ambulatory Visit: Payer: Self-pay | Admitting: Family Medicine

## 2018-10-30 ENCOUNTER — Telehealth: Payer: Self-pay

## 2018-10-30 NOTE — Telephone Encounter (Signed)
Spoke with patient and advised that he is overdue for a follow up appt for his blood pressure.  Pt stated that he was unsure of his work schedule at this point and would have to call back to schedule appt.

## 2019-01-16 ENCOUNTER — Other Ambulatory Visit: Payer: Self-pay

## 2019-01-16 DIAGNOSIS — R7989 Other specified abnormal findings of blood chemistry: Secondary | ICD-10-CM
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
